# Patient Record
Sex: Male | Born: 1995
Health system: Southern US, Community
[De-identification: ages and names within clinical notes are randomized; demographics above are authoritative.]

## PROBLEM LIST (undated history)

## (undated) DIAGNOSIS — T7840XA Allergy, unspecified, initial encounter: Secondary | ICD-10-CM

## (undated) HISTORY — DX: Allergy, unspecified, initial encounter: T78.40XA

---
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
## 2018-05-07 ENCOUNTER — Ambulatory Visit: Payer: Self-pay

## 2018-05-07 NOTE — Telephone Encounter (Signed)
If he needs to get be sooner, we can find him a spot

## 2018-05-07 NOTE — Telephone Encounter (Signed)
Phone call from pt.  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  Stated he had the last episode about 2-3 mos. Ago, when he was doing conditioning exercise, and had not warmed up.  Stated he was on a treadmill, and started becoming short of breath, so he began taking deep breaths, then became dizzy, and started "dry heaving."  Also, reported his heart rate felt like it was going at a faster rate.  Denied obtaining a pulse at that time.  Stated he has also experienced a heaviness in chest at time of the shortness of breath and coughing episodes.  The pt. explained that it would be typical to do "conditioning" exercise after baseball practice; ie: push ups, jogging, or treadmill, or an exercise that would get his heart rate up.  Per pt., if he hasn't warmed-up prior to the conditioning routine, the episodes of shortness of breath and coughing up of mucus could be triggered.  Today, denied any shortness of breath or cough.  Stated he is able to go about his normal routine without any shortness of breath or cough.  Denied any chest congestion, chest pain, or SOB at present time.  Pt. was given a new patient appt. 06/19/18, since hasn't been seen at office since 2015.  Was advised to limit exercises to mild-moderate exertion level, until he is evaluated; encouraged to call back, if symptoms recur or worsen.   Verb. Understanding.  Agrees with plan.              Answer Assessment - Initial Assessment Questions 1. RESPIRATORY STATUS: "Describe your breathing?" (e.g., wheezing, shortness of breath, unable to speak, severe coughing)      Breathing at rest is normal  2. ONSET: "When did this breathing problem begin?"      Worsening over past 2 years; more frequent occurance 3. PATTERN "Does the difficult  breathing come and go, or has it been constant since it started?"      Intermittent  4. SEVERITY: "How bad is your breathing?" (e.g., mild, moderate, severe)    - MILD: No SOB at rest, mild SOB with walking, speaks normally in sentences, can lay down, no retractions, pulse < 100.    - MODERATE: SOB at rest, SOB with minimal exertion and prefers to sit, cannot lie down flat, speaks in phrases, mild retractions, audible wheezing, pulse 100-120.    - SEVERE: Very SOB at rest, speaks in single words, struggling to breathe, sitting hunched forward, retractions, pulse > 120      Moderate 5. RECURRENT SYMPTOM: "Have you had difficulty breathing before?" If so, ask: "When was the last time?" and "What happened that time?"      increase in shortness of breath in past 2 years when exercising.  6. CARDIAC HISTORY: "Do you have any history of heart disease?" (e.g., heart attack, angina, bypass surgery, angioplasty)      No hx of heart problems; hx of vasovagal response in his teens  7. LUNG HISTORY: "Do you have any history of lung disease?"  (e.g., pulmonary embolus, asthma, emphysema)     Denied asthma, COPD.  8. CAUSE: "What do you think is causing the breathing problem?"      unsure 9. OTHER SYMPTOMS: "Do you have any other symptoms? (e.g., dizziness, runny nose, cough, chest  pain, fever)    Occas. Lightheaded, a little bit of heaviness / cramp on left side of chest,  Protocols used: BREATHING DIFFICULTY-A-AH

## 2018-05-07 NOTE — Telephone Encounter (Signed)
FYI

## 2018-05-08 NOTE — Telephone Encounter (Signed)
Left a message for a return call.  CRM created. 

## 2018-05-08 NOTE — Telephone Encounter (Signed)
Pt now scheduled for 05/13/18.

## 2018-05-13 ENCOUNTER — Ambulatory Visit: Payer: 59 | Admitting: Adult Health

## 2018-05-13 VITALS — BP 120/60 | Temp 97.5°F | Wt 240.0 lb

## 2018-05-13 DIAGNOSIS — J45990 Exercise induced bronchospasm: Secondary | ICD-10-CM

## 2018-05-13 MED ORDER — ALBUTEROL SULFATE HFA 108 (90 BASE) MCG/ACT IN AERS: 2.0000 | INHALATION_SPRAY | Freq: Four times a day (QID) | RESPIRATORY_TRACT | 2 refills | Status: DC | PRN

## 2018-05-13 NOTE — Progress Notes (Signed)
Subjective:    Patient ID: Christopher Parks, male    DOB: May 21, 1996, 22 y.o.   MRN: [id number]  HPI 22 year old male who  has a past medical history of Allergy. He will be establishing care with me at a later date. Today he presents to the office today for an acute complaint SOB   He reports that he has shortness of breath and cough when he is working on conditioning exercises. He has noticed these episodes occurring intermittently over the last 4 years, but has been increasing in frequency over the last 2 years. Reports his symptoms are more present when he is running outside or doing aerobic exercise. He is training on becoming a [place] [employer] and has been exercising more frequently, so he has noticed his symptoms more frequent.y. Associated symptoms include the feeling of " someone sitting on my chest, wheezing and fast heart rate. He feels as though his symptoms last 2-20 minutes before they resolve  He has never been diagnosed with asthma. His mom has exercise Induced asthma.   He does not smoke   No symptoms at rest.    Review of Systems See HPI   Past Medical History:  Diagnosis Date  . Allergy     Social History   Socioeconomic History  . Marital status: Married    Spouse name: Not on file  . Number of children: Not on file  . Years of education: Not on file  . Highest education level: Not on file  Occupational History  . Not on file  Social Needs  . Financial resource strain: Not on file  . Food insecurity:    Worry: Not on file    Inability: Not on file  . Transportation needs:    Medical: Not on file    Non-medical: Not on file  Tobacco Use  . Smoking status: Never Smoker  Substance and Sexual Activity  . Alcohol use: No  . Drug use: No  . Sexual activity: Not on file  Lifestyle  . Physical activity:    Days per week: Not on file    Minutes per session: Not on file  . Stress: Not on file  Relationships  . Social connections:    Talks on phone: Not on  file    Gets together: Not on file    Attends religious service: Not on file    Active member of club or organization: Not on file    Attends meetings of clubs or organizations: Not on file    Relationship status: Not on file  . Intimate partner violence:    Fear of current or ex partner: Not on file    Emotionally abused: Not on file    Physically abused: Not on file    Forced sexual activity: Not on file  Other Topics Concern  . Not on file  Social History Narrative  . Not on file    No past surgical history on file.  No family history on file.  No Known Allergies  No current outpatient medications on file prior to visit.   No current facility-administered medications on file prior to visit.     BP 120/60   Temp (!) 97.5 F (36.4 C) (Oral)   Wt 240 lb (108.9 kg)       Objective:   Physical Exam  Constitutional: He is oriented to person, place, and time. He appears well-developed and well-nourished. No distress.  HENT:  Head: Normocephalic and atraumatic.  Mouth/Throat:  Oropharynx is clear and moist. No oropharyngeal exudate.  Neck: Normal range of motion. Neck supple.  Cardiovascular: Normal rate, regular rhythm, normal heart sounds and intact distal pulses. Exam reveals no gallop and no friction rub.  No murmur heard. Pulmonary/Chest: Effort normal and breath sounds normal. No stridor. No respiratory distress. He has no wheezes. He has no rales. He exhibits no tenderness.  Musculoskeletal: Normal range of motion. He exhibits no edema, tenderness or deformity.  Neurological: He is alert and oriented to person, place, and time.  Skin: Skin is warm and dry. Capillary refill takes less than 2 seconds. He is not diaphoretic.  Psychiatric: He has a normal mood and affect. His behavior is normal. Judgment and thought content normal.  Vitals reviewed.     Assessment & Plan:  1. Exercise-induced asthma - albuterol (PROVENTIL HFA;VENTOLIN HFA) 108 (90 Base) MCG/ACT  inhaler; Inhale 2 puffs into the lungs every 6 (six) hours as needed for wheezing or shortness of breath.  Dispense: 1 Inhaler; Refill: 2 - Follow up if no improvement over the next 2 weeks  - Consider referral to pulmonary and imaging   Dorothyann Peng, NP

## 2018-06-19 ENCOUNTER — Ambulatory Visit: Payer: 59 | Admitting: Adult Health

## 2019-12-29 ENCOUNTER — Other Ambulatory Visit: Payer: Self-pay | Admitting: *Deleted

## 2019-12-29 ENCOUNTER — Telehealth: Payer: Self-pay | Admitting: *Deleted

## 2019-12-29 DIAGNOSIS — R0600 Dyspnea, unspecified: Secondary | ICD-10-CM

## 2019-12-29 DIAGNOSIS — R002 Palpitations: Secondary | ICD-10-CM

## 2019-12-29 NOTE — Telephone Encounter (Signed)
Patient enrolled for [employer] to mail a 14 day ZIO XT long term holter monitor to college address, [address], [place], [place] [address].  Patient contact number [phone].  Reviewed instructions briefly as they are included in the monitor kit.

## 2020-01-01 ENCOUNTER — Telehealth: Payer: Self-pay

## 2020-01-01 DIAGNOSIS — R002 Palpitations: Secondary | ICD-10-CM

## 2020-01-01 DIAGNOSIS — R0600 Dyspnea, unspecified: Secondary | ICD-10-CM

## 2020-01-01 NOTE — Telephone Encounter (Signed)
-----   Message from Josue Hector, MD sent at 12/29/2019 11:03 AM EST ----- Add on to my schedule next week or two Needs 14 day monitor now can mail it to him or his parents He is in school at [employer] in [place] and parents in [place] have him see me in next two weeks for echo and new patient eval same day.  Having dyspnea and palpitations Mother has history of atrial myxoma   His cell phone is [phone]

## 2020-01-01 NOTE — Telephone Encounter (Signed)
Left message for patient to call back. Shelly, monitor tech has already  Sent patient a 14 day monitor. Will place order for echo and once patient calls back will arrange office visit with echo same day.

## 2020-01-01 NOTE — Telephone Encounter (Signed)
Called patient back. Made patient an appointment with Dr. Johnsie Cancel and Echo.

## 2020-01-01 NOTE — Telephone Encounter (Signed)
Patient returning call.

## 2020-01-04 NOTE — Progress Notes (Signed)
CARDIOLOGY CONSULT NOTE       Patient ID: Christopher Parks MRN: [id number] DOB/AGE: 07-08-1996 24 y.o.  Admit date: (Not on file) Referring Physician: Sherren Mocha Primary Physician: Dorena Cookey, MD (Inactive) Primary Cardiologist: New Reason for Consultation: Palpitations  Active Problems:   * No active hospital problems. *   HPI:  24 y.o. referred for palpitations.  Christopher Parks is a friend. He use to play baseball with my son. I take care of his mother Who has had atrial myoxoma resected. He is currently at [facility]. Has been having tachy- palpitations for weeks. Has some exercise induced asthma he uses inhaler for on occasion Non smoker Some ETOH on weekends not excessive No supplements, stimulants or other drugs. 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 Allergy     History reviewed. No pertinent family history.  Social History   Socioeconomic History  . Marital status: Married    Spouse name: Not on file  . Number of children: Not on file  . Years of education: Not on file  . Highest education level: Not on file  Occupational History  . Not on file  Tobacco Use  . Smoking status: Never Smoker  . Smokeless tobacco: Current User  Substance and Sexual Activity  . Alcohol use: Yes    Comment: 5-7 drinks every other week  . Drug use: No  . Sexual activity: Not on file  Other Topics Concern  . Not on file  Social History Narrative  . Not on file   Social Determinants of Health   Financial Resource Strain:   . Difficulty of Paying Living Expenses: Not on file  Food Insecurity:   . Worried About Charity fundraiser in the Last Year: Not on file  . Ran Out of Food in the Last Year:  Not on file  Transportation Needs:   . Lack of Transportation (Medical): Not on file  . Lack of Transportation (Non-Medical): Not on file  Physical Activity:   . Days of Exercise per Week: Not on file  . Minutes of Exercise per Session: Not on file  Stress:   . Feeling of Stress : Not on file  Social Connections:   . Frequency of Communication with Friends and Family: Not on file  . Frequency of Social Gatherings with Friends and Family: Not on file  . Attends Religious Services: Not on file  . Active Member of Clubs or Organizations: Not on file  . Attends Archivist Meetings: Not on file  . Marital Status: Not on file  Intimate Partner Violence:   . Fear of Current or Ex-Partner: Not on file  . Emotionally Abused: Not on file  . Physically Abused: Not on file  . Sexually Abused: Not on file    History reviewed. No pertinent surgical history.    Current Outpatient Medications:  .  propranolol (INDERAL) 10 MG tablet, Take 1 tablet (10 mg total) by mouth as needed (palpitations)., Disp: 30 tablet, Rfl: 2    Physical Exam: Blood pressure 110/68, pulse 77, height 5\' 11"  (1.803 m), weight 248 lb 12.8 oz (112.9 kg), SpO2 98 %.    Affect appropriate Healthy:  appears stated age  HEENT: normal Neck supple with no adenopathy JVP normal no bruits no thyromegaly Lungs clear with no wheezing and good diaphragmatic motion Heart:  S1/S2 no murmur, no rub, gallop or click PMI normal Abdomen: benighn, BS positve, no tenderness, no AAA no bruit.  No HSM or HJR Distal pulses intact with no bruits No edema Neuro non-focal Skin warm and dry No muscular weakness   Labs: None    Radiology: ECHOCARDIOGRAM COMPLETE  Result Date: 01/12/2020    ECHOCARDIOGRAM REPORT   Patient Name:   Christopher Parks Date of Exam: 01/12/2020 Medical Rec #:  [id number]      Height:       71.0 in Accession #:    [id number]     Weight:       240.0 lb Date of Birth:  29-Mar-1996       BSA:          2.28  m Patient Age:    24 years       BP:           120/60 mmHg Patient Gender: M              HR:           89 bpm. Exam Location:  [facility] Procedure: 2D Echo, 3D Echo, Cardiac Doppler, Color Doppler and Strain Analysis Indications:    R00.2 Palpitations                 R06.00 Dyspnea  History:        Patient has prior history of Echocardiogram examinations, most                 recent 05/22/2011. Signs/Symptoms:Dyspnea. COVID 19 Virus                 (November 2020), Palpitations.  Sonographer:    Deliah Boston RDCS Referring Phys: [place]  1. Left ventricular ejection fraction, by estimation, is 60 to 65%. Left ventricular ejection fraction by 3D volume is 60 %. The left ventricle has normal function. The left ventricle has no regional wall motion abnormalities. Left ventricular diastolic  parameters were normal. The average left ventricular global longitudinal strain is -18.0 %.  2. Right ventricular systolic function is normal. The right ventricular size is normal. Tricuspid regurgitation signal is inadequate for assessing PA pressure.  3. The mitral valve is grossly normal. No evidence of mitral valve regurgitation. No evidence of mitral stenosis.  4. The aortic valve is tricuspid. Aortic valve regurgitation is not visualized. No aortic stenosis is present.  5. The inferior vena cava is normal in size with greater than 50% respiratory variability, suggesting right atrial pressure of 3 mmHg. Conclusion(s)/Recomendation(s): Normal biventricular function without evidence of hemodynamically significant valvular heart disease. FINDINGS  Left Ventricle: Left ventricular ejection fraction, by estimation, is 60 to 65%. Left ventricular ejection fraction by 3D volume is 60 % The left ventricle has normal function. The left ventricle has no regional wall motion abnormalities. The average left ventricular global longitudinal strain is -18.0 %. The left ventricular internal cavity size was  normal in size. There is no left ventricular hypertrophy. Left ventricular diastolic parameters were normal. Right Ventricle: The right ventricular size is normal. No increase in right ventricular wall thickness. Right ventricular systolic function is normal. Tricuspid regurgitation signal is inadequate for assessing PA pressure. Left Atrium: Left atrial size was normal in size. Right Atrium: Right atrial size was normal in size. Pericardium: There  is no evidence of pericardial effusion. Mitral Valve: The mitral valve is grossly normal. No evidence of mitral valve regurgitation. No evidence of mitral valve stenosis. Tricuspid Valve: The tricuspid valve is grossly normal. Tricuspid valve regurgitation is not demonstrated. Aortic Valve: The aortic valve is tricuspid. Aortic valve regurgitation is not visualized. No aortic stenosis is present. Pulmonic Valve: The pulmonic valve was grossly normal. Pulmonic valve regurgitation is not visualized. No evidence of pulmonic stenosis. Aorta: The aortic root and ascending aorta are structurally normal, with no evidence of dilitation. Venous: The inferior vena cava is normal in size with greater than 50% respiratory variability, suggesting right atrial pressure of 3 mmHg. IAS/Shunts: No atrial level shunt detected by color flow Doppler.  LEFT VENTRICLE PLAX 2D LVIDd:         5.29 cm         Diastology LVIDs:         3.52 cm         LV e' lateral:   19.60 cm/s LV PW:         0.82 cm         LV E/e' lateral: 3.5 LV IVS:        0.64 cm         LV e' medial:    13.60 cm/s LVOT diam:     2.70 cm         LV E/e' medial:  5.0 LV SV:         124.82 ml LV SV Index:   35.10           2D LVOT Area:     5.73 cm        Longitudinal                                Strain                                2D Strain GLS  -18.0 %                                Avg:                                 3D Volume EF                                LV 3D EF:    Left                                              ventricular                                             ejection  fraction by                                             3D volume                                             is 60 % RIGHT VENTRICLE RV S prime:     14.50 cm/s TAPSE (M-mode): 2.6 cm LEFT ATRIUM           Index LA diam:      3.70 cm 1.62 cm/m LA Vol (A2C): 40.7 ml 17.86 ml/m  AORTIC VALVE LVOT Vmax:   115.00 cm/s LVOT Vmean:  74.700 cm/s LVOT VTI:    0.218 m  AORTA Ao Root diam: 3.00 cm Ao Asc diam:  2.80 cm MITRAL VALVE MV Area (PHT): cm         SHUNTS MV Decel Time: 211 msec    Systemic VTI:  0.22 m MV E velocity: 67.90 cm/s  Systemic Diam: 2.70 cm MV A velocity: 43.20 cm/s MV E/A ratio:  1.57 Eleonore Chiquito MD Electronically signed by Eleonore Chiquito MD Signature Date/Time: 01/12/2020/8:23:42 PM    Final     EKG: SR rate 80 normal 05/14/11  01/13/20 SR rate 75 normal    ASSESSMENT AND PLAN:   1. Palpitations: benign will review monitor when available PRN Inderal called in  2. Asthma:  Has used inhaler in past no active wheezing 3. Anxiety: normal for current college student, during [place] restrictions and stress of gaining Entrance into work force in his case [employer]   Signed: Jenkins Rouge 01/13/2020, 11:48 AM

## 2020-01-07 NOTE — Telephone Encounter (Signed)
[employer] was called.  Monitor was delivered 12/31/2019 at 2:01 PM to [address] [place] , [place].  Package was signed for by a Mr. Or Ms. Soto. 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 Patient given [employer] telephone number [phone].  If he cannot track down his package, please call [employer] and they will overnight a second monitor.

## 2020-01-07 NOTE — Telephone Encounter (Signed)
Patient states that his address is [address] in [place]. Just wanted to make sure this would not be an issue when shipping.

## 2020-01-08 ENCOUNTER — Other Ambulatory Visit (INDEPENDENT_AMBULATORY_CARE_PROVIDER_SITE_OTHER): Payer: 59

## 2020-01-08 DIAGNOSIS — R0600 Dyspnea, unspecified: Secondary | ICD-10-CM

## 2020-01-08 DIAGNOSIS — R002 Palpitations: Secondary | ICD-10-CM

## 2020-01-12 ENCOUNTER — Other Ambulatory Visit: Payer: Self-pay

## 2020-01-12 ENCOUNTER — Ambulatory Visit (HOSPITAL_COMMUNITY): Payer: 59 | Attending: Cardiovascular Disease

## 2020-01-12 DIAGNOSIS — R002 Palpitations: Secondary | ICD-10-CM

## 2020-01-12 DIAGNOSIS — R0600 Dyspnea, unspecified: Secondary | ICD-10-CM | POA: Diagnosis present

## 2020-01-13 ENCOUNTER — Encounter: Payer: Self-pay | Admitting: Cardiovascular Disease

## 2020-01-13 ENCOUNTER — Ambulatory Visit: Payer: 59 | Admitting: Cardiovascular Disease

## 2020-01-13 VITALS — BP 110/68 | HR 77 | Ht 71.0 in | Wt 248.8 lb

## 2020-01-13 DIAGNOSIS — R002 Palpitations: Secondary | ICD-10-CM

## 2020-01-13 MED ORDER — PROPRANOLOL HCL 10 MG PO TABS: 10.0000 mg | ORAL_TABLET | ORAL | 2 refills | Status: AC | PRN

## 2020-01-13 NOTE — Patient Instructions (Addendum)
Medication Instructions:  Your physician has recommended you make the following change in your medication:  1-Take Inderal 10 mg by mouth as needed for palpitations.  *If you need a refill on your cardiac medications before your next appointment, please call your pharmacy*  Lab Work:  If you have labs (blood work) drawn today and your tests are completely normal, you will receive your results only by: Marland Kitchen MyChart Message (if you have MyChart) OR . A paper copy in the mail If you have any lab test that is abnormal or we need to change your treatment, we will call you to review the results.  Testing/Procedures: None ordered today.  Follow-Up: At [facility], you and your health needs are our priority.  As part of our continuing mission to provide you with exceptional heart care, we have created designated Provider Care Teams.  These Care Teams include your primary Cardiologist (physician) and Advanced Practice Providers (APPs -  Physician Assistants and Nurse Practitioners) who all work together to provide you with the care you need, when you need it.  Your next appointment:   As needed  The format for your next appointment:   In Person  Provider:   You may see Dr. Johnsie Cancel or one of the following Advanced Practice Providers on your designated Care Team:    Truitt Merle, NP  Cecilie Kicks, NP  Kathyrn Drown, NP

## 2020-02-02 ENCOUNTER — Telehealth: Payer: Self-pay

## 2020-02-02 DIAGNOSIS — R002 Palpitations: Secondary | ICD-10-CM

## 2020-02-02 DIAGNOSIS — R001 Bradycardia, unspecified: Secondary | ICD-10-CM

## 2020-02-02 DIAGNOSIS — R0600 Dyspnea, unspecified: Secondary | ICD-10-CM

## 2020-02-02 NOTE — Telephone Encounter (Signed)
-----   Message from Josue Hector, MD sent at 02/01/2020  8:37 AM EST ----- No rapid arrhythmias but HR slow at times longest 7 second pause Needs cardiac MRI to r/o infiltrative DCM Echo was fine Likely pauses from vagal tone but needs MRI

## 2020-02-02 NOTE — Telephone Encounter (Signed)
Called patient with results. Patient will be in town for over a week,  will ordered cardiac MRI.

## 2020-02-15 ENCOUNTER — Telehealth: Payer: Self-pay | Admitting: *Deleted

## 2020-02-15 ENCOUNTER — Encounter: Payer: Self-pay | Admitting: Cardiovascular Disease

## 2020-02-15 NOTE — Telephone Encounter (Signed)
Left message regarding appointment for Cardiac MRI scheduled Monday 03/07/20 at 2:00pm at [facility]---arrival time is 1:15 pm 1st floor radiology---will mail information to patient

## 2020-03-01 ENCOUNTER — Encounter: Payer: Self-pay | Admitting: Cardiovascular Disease

## 2020-03-01 NOTE — Telephone Encounter (Signed)
Left message regarding appointment for Cardiac MRI scheduled Monday 03/28/20 at 9:00 am [facility].  Arrival time will be 8:15 am --1st floor radiology---will mail information to patient.

## 2020-03-07 ENCOUNTER — Other Ambulatory Visit (HOSPITAL_COMMUNITY): Payer: 59

## 2020-03-17 ENCOUNTER — Telehealth: Payer: Self-pay | Admitting: *Deleted

## 2020-03-17 ENCOUNTER — Encounter: Payer: Self-pay | Admitting: Cardiovascular Disease

## 2020-03-17 NOTE — Telephone Encounter (Signed)
Left message for patient regarding appointment for Cardiac MRI scheduled Monday 04/18/20 at 8:00 am at [facility]---arrival time is 7:15 am 1st floor admissions office.  Will mail updated information to patient

## 2020-03-28 ENCOUNTER — Other Ambulatory Visit (HOSPITAL_COMMUNITY): Payer: 59

## 2020-04-15 ENCOUNTER — Telehealth (HOSPITAL_COMMUNITY): Payer: Self-pay | Admitting: *Deleted

## 2020-04-15 NOTE — Telephone Encounter (Signed)
Reaching out to patient to offer assistance regarding upcoming cardiac imaging study; pt verbalizes understanding of appt date/time, parking situation and where to check in, and verified current allergies; name and call back number provided for further questions should they arise  [facility] [phone] office [phone] cell

## 2020-04-18 ENCOUNTER — Other Ambulatory Visit: Payer: Self-pay

## 2020-04-18 ENCOUNTER — Ambulatory Visit: Payer: 59 | Admitting: Cardiovascular Disease

## 2020-04-18 ENCOUNTER — Other Ambulatory Visit: Payer: Self-pay | Admitting: Cardiovascular Disease

## 2020-04-18 ENCOUNTER — Ambulatory Visit (HOSPITAL_COMMUNITY)
Admission: RE | Admit: 2020-04-18 | Discharge: 2020-04-18 | Disposition: A | Discharge status: Home / Self Care | Payer: 59 | Source: Ambulatory Visit | Attending: Cardiovascular Disease | Admitting: Cardiovascular Disease

## 2020-04-18 ENCOUNTER — Inpatient Hospital Stay (HOSPITAL_COMMUNITY): Admission: RE | Admit: 2020-04-18 | Payer: 59 | Source: Ambulatory Visit

## 2020-04-18 DIAGNOSIS — R001 Bradycardia, unspecified: Secondary | ICD-10-CM | POA: Diagnosis not present

## 2020-04-18 DIAGNOSIS — R002 Palpitations: Secondary | ICD-10-CM | POA: Insufficient documentation

## 2020-04-18 DIAGNOSIS — R0600 Dyspnea, unspecified: Secondary | ICD-10-CM | POA: Insufficient documentation

## 2020-04-18 MED ORDER — GADOBUTROL 1 MMOL/ML IV SOLN
10.0000 mL | Freq: Once | INTRAVENOUS | Status: AC | PRN
  Administered 2020-04-18: 10 mL via INTRAVENOUS

## 2020-05-27 ENCOUNTER — Ambulatory Visit
Admission: RE | Admit: 2020-05-27 | Discharge: 2020-05-27 | Disposition: A | Discharge status: Home / Self Care | Payer: No Typology Code available for payment source | Source: Ambulatory Visit | Attending: Nurse Practitioner | Admitting: Nurse Practitioner

## 2020-05-27 ENCOUNTER — Other Ambulatory Visit: Payer: Self-pay | Admitting: Nurse Practitioner

## 2020-05-27 DIAGNOSIS — Z021 Encounter for pre-employment examination: Secondary | ICD-10-CM

## 2021-06-09 IMAGING — MR MR CARD MORPHOLOGY WO/W CM
39 of 42 series · 39 of 42 positions shown · IV contrast (gadavist)
Comparison: none

CLINICAL DATA: Bradycardia R/O Infiltrative DCM

EXAM:
CARDIAC MRI
TECHNIQUE: The patient was scanned on a 1.5 Tesla GE magnet. A dedicated
cardiac coil was used. Functional imaging was done using Fiesta
sequences. [DATE], and 4 chamber views were done to assess for RWMA's.
Modified De Andrade rule using a short axis stack was used to
calculate an ejection fraction on a dedicated work station using
Circle software. The patient received 10 cc of Gadavist. After 10
minutes inversion recovery sequences were used to assess for
infiltration and scar tissue.
CONTRAST:  Gadavist

[Series 4: [id number]_bh · axial · 8.0mm · 1.33mm/px · 1 of 16 slices shown]
[im 1/16]
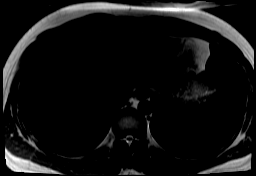

[Series 10: bSSFP · oblique · 8.0mm · 1.61mm/px · 1 of 25 slices shown (1 of 22)]
[im 1/25]
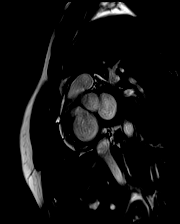

[Series 11: bSSFP · oblique · 8.0mm · 1.61mm/px · 1 of 25 slices shown (2 of 22)]
[im 1/25]
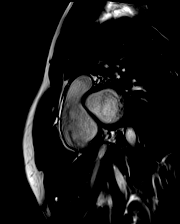

[Series 12: bSSFP · oblique · 8.0mm · 1.61mm/px · 1 of 25 slices shown (3 of 22)]
[im 1/25]
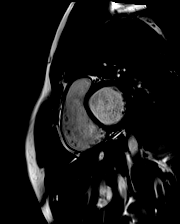

[Series 13: bSSFP · oblique · 8.0mm · 1.61mm/px · 1 of 25 slices shown (4 of 22)]
[im 1/25]
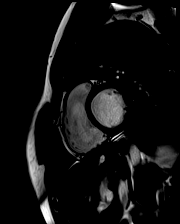

[Series 14: bSSFP · oblique · 8.0mm · 1.61mm/px · 1 of 25 slices shown (5 of 22)]
[im 1/25]
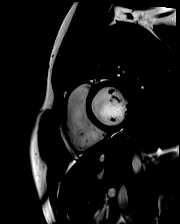

[Series 15: bSSFP · oblique · 8.0mm · 1.61mm/px · 1 of 25 slices shown (6 of 22)]
[im 1/25]
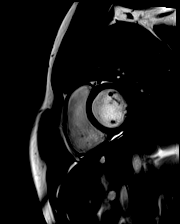

[Series 16: bSSFP · oblique · 8.0mm · 1.61mm/px · 1 of 25 slices shown (7 of 22)]
[im 1/25]
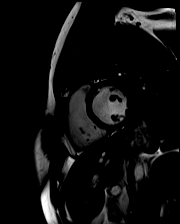

[Series 17: bSSFP · oblique · 8.0mm · 1.61mm/px · 1 of 25 slices shown (8 of 22)]
[im 1/25]
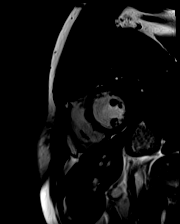

[Series 18: bSSFP · oblique · 8.0mm · 1.61mm/px · 1 of 25 slices shown (9 of 22)]
[im 1/25]
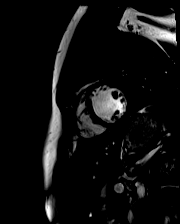

[Series 19: bSSFP · oblique · 8.0mm · 1.61mm/px · 1 of 25 slices shown (10 of 22)]
[im 1/25]
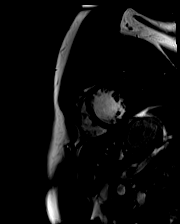

[Series 20: bSSFP · oblique · 8.0mm · 1.61mm/px · 1 of 25 slices shown (11 of 22)]
[im 1/25]
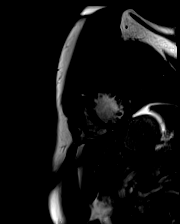

[Series 21: bSSFP · oblique · 8.0mm · 1.61mm/px · 1 of 25 slices shown (12 of 22)]
[im 1/25]
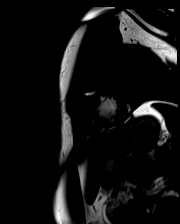

[Series 22: bSSFP · oblique · 8.0mm · 1.61mm/px · 1 of 25 slices shown (13 of 22)]
[im 1/25]
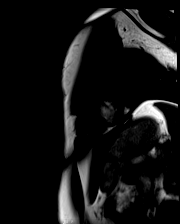

[Series 23: bSSFP · oblique · 8.0mm · 1.61mm/px · 1 of 25 slices shown (14 of 22)]
[im 1/25]
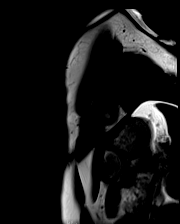

[Series 24: bSSFP · oblique · 8.0mm · 1.61mm/px · 1 of 25 slices shown (15 of 22)]
[im 1/25]
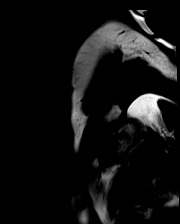

[Series 25: bSSFP · oblique · 8.0mm · 1.61mm/px · 1 of 25 slices shown (16 of 22)]
[im 1/25]
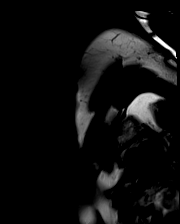

[Series 26: (id)_long_t1 · oblique · 8.0mm · 1.41mm/px · 1 of 24 slices shown]
[im 1/24]
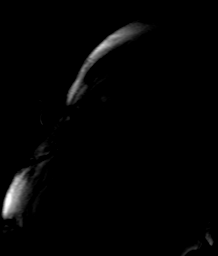

[Series 27: (id)_long_t1_moco · oblique · 8.0mm · 1.41mm/px · 1 of 24 slices shown]
[im 1/24]
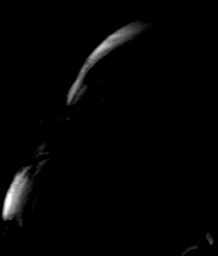

[Series 28: (id)_long_t1_moco_t1 · oblique · 8.0mm · 1.41mm/px · 1 of 6 slices shown]
[im 1/6]
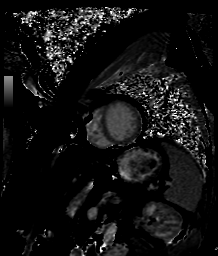

[Series 30: (id)_trufi · oblique · 8.0mm · 1.88mm/px · 1 of 9 slices shown]
[im 1/9]
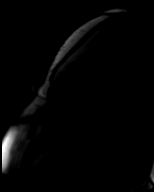

[Series 31: (id)_trufi_moco · oblique · 8.0mm · 1.88mm/px · 1 of 9 slices shown]
[im 1/9]
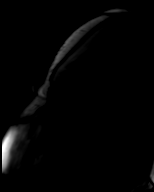

[Series 32: (id)_trufi_moco_t2 · oblique · 8.0mm · 1.88mm/px · 1 of 3 slices shown]
[im 1/3]
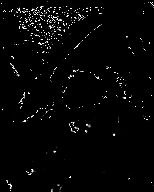

[Series 34: bSSFP · oblique · 6.0mm · 1.41mm/px · 1 of 25 slices shown (17 of 22)]
[im 1/25]
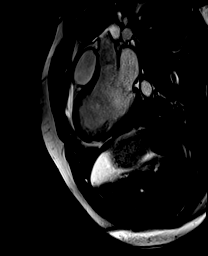

[Series 35: bSSFP · oblique · 6.0mm · 1.41mm/px · 1 of 25 slices shown (18 of 22)]
[im 1/25]
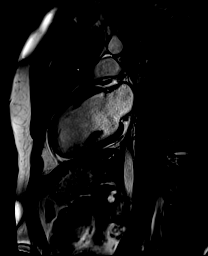

[Series 36: bSSFP · oblique · 6.0mm · 1.41mm/px · 1 of 25 slices shown (19 of 22)]
[im 1/25]
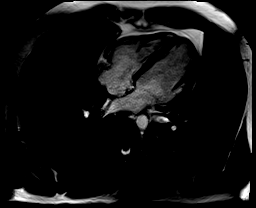

[Series 37: STIR · oblique · 8.0mm · 1.73mm/px · 1 of 17 slices shown]
[im 1/17]
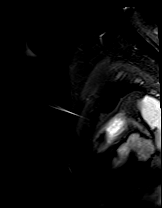

[Series 38: bSSFP · coronal · 6.0mm · 1.41mm/px · 1 of 25 slices shown (20 of 22)]
[im 1/25]
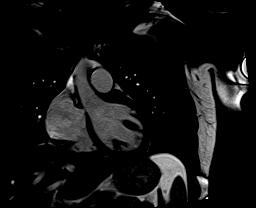

[Series 39: aortic valve cine · axial · 6.0mm · 1.41mm/px · 1 of 25 slices shown (1 of 2)]
[im 1/25]
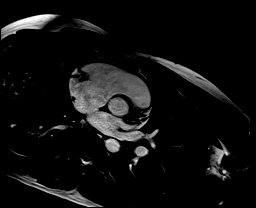

[Series 40: cine rvot · sagittal · 6.0mm · 1.41mm/px · 1 of 25 slices shown]
[im 1/25]
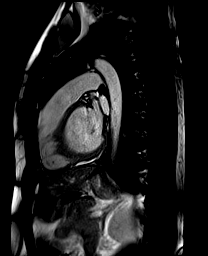

[Series 41: aortic valve cine · axial · 6.0mm · 1.41mm/px · 1 of 25 slices shown (2 of 2)]
[im 1/25]
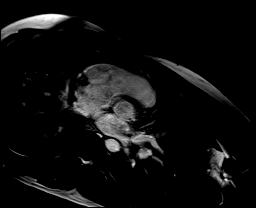

[Series 42: cine rvit · coronal · 6.0mm · 1.41mm/px · 1 of 25 slices shown]
[im 1/25]
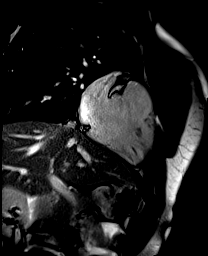

[Series 44: lge_single shot sa · oblique · 8.0mm · 1.98mm/px · 1 of 10 slices shown (1 of 2)]
[im 1/10]
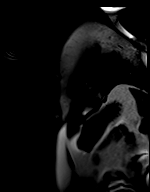

[Series 45: lge_single shot sa · oblique · 8.0mm · 1.98mm/px · 1 of 10 slices shown (2 of 2)]
[im 1/10]
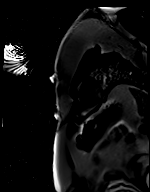

[Series 52: (id)_short_t1 · oblique · 8.0mm · 1.41mm/px · 1 of 27 slices shown]
[im 1/27]
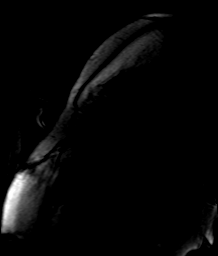

[Series 53: (id)_short_t1_moco · oblique · 8.0mm · 1.41mm/px · 1 of 27 slices shown]
[im 1/27]
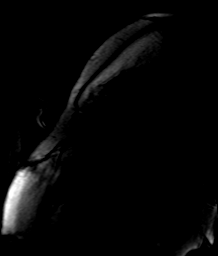

[Series 54: (id)_short_t1_moco_t1 · oblique · 8.0mm · 1.41mm/px · 1 of 6 slices shown]
[im 1/6]
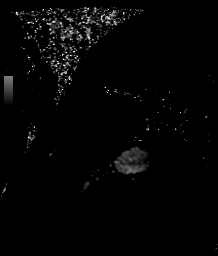

[Series 57: bSSFP · oblique · 6.0mm · 1.73mm/px · 1 of 19 slices shown (21 of 22)]
[im 1/19]
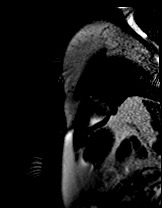

[Series 58: bSSFP · oblique · 6.0mm · 1.73mm/px · 1 of 19 slices shown (22 of 22)]
[im 1/19]
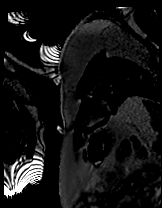

[39 of 42 positions shown; findings below may reference images not displayed]

FINDINGS: Normal cardiac chamber sizes. No ASD/PFO. Normal aortic root. No
pericardial effusion. Normal MV, TV, AV. Normal origin of LM and RCA
arteries

Quantitative EF 57%. (EDV 187 cc ESV 80 cc SV 107 cc) no RWMAls.
Delayed enhancement images with gadolinium showed no infiltration,
scar, infarct or abnormality Normal RV size and function
IMPRESSION: 1. Normal Cardiac MRI

2.  Normal TV, MV, AV

3.  Normal RV size and function

4.  Normal LV size and function quantitative EF 57%

5.  No pericardial effusion

6.  No ASD/PFO

7.  Normal aortic root 2.8 cm

8. No delayed gadolinium uptake in myocardium/pericardium on delayed
inversion recovery sequences

Sangsa Kaish

## 2023-01-10 ENCOUNTER — Other Ambulatory Visit (HOSPITAL_COMMUNITY): Payer: Self-pay

## 2023-01-10 MED ORDER — LISDEXAMFETAMINE DIMESYLATE 30 MG PO CAPS
30.0000 mg | ORAL_CAPSULE | Freq: Every morning | ORAL | 0 refills | Status: AC
  Filled 2023-01-10: qty 15, 15d supply, fill #0

## 2023-01-22 ENCOUNTER — Other Ambulatory Visit (HOSPITAL_COMMUNITY): Payer: Self-pay

## 2023-01-22 MED ORDER — LISDEXAMFETAMINE DIMESYLATE 40 MG PO CAPS
40.0000 mg | ORAL_CAPSULE | Freq: Every morning | ORAL | 0 refills | Status: DC
  Filled 2023-01-22: qty 15, 15d supply, fill #0

## 2023-02-06 ENCOUNTER — Other Ambulatory Visit (HOSPITAL_COMMUNITY): Payer: Self-pay

## 2023-02-06 MED ORDER — LISDEXAMFETAMINE DIMESYLATE 40 MG PO CAPS
40.0000 mg | ORAL_CAPSULE | Freq: Every morning | ORAL | 0 refills | Status: DC
  Filled 2023-02-06: qty 30, 30d supply, fill #0

## 2023-02-06 MED ORDER — FLUOXETINE HCL 40 MG PO CAPS
40.0000 mg | ORAL_CAPSULE | Freq: Every day | ORAL | 0 refills | Status: DC
  Filled 2023-02-06 – 2023-02-28 (×2): qty 30, 30d supply, fill #0

## 2023-02-06 MED ORDER — LISDEXAMFETAMINE DIMESYLATE 20 MG PO CAPS
20.0000 mg | ORAL_CAPSULE | ORAL | 0 refills | Status: DC
  Filled 2023-02-06: qty 30, 30d supply, fill #0

## 2023-02-28 ENCOUNTER — Other Ambulatory Visit (HOSPITAL_COMMUNITY): Payer: Self-pay

## 2023-03-01 ENCOUNTER — Other Ambulatory Visit (HOSPITAL_COMMUNITY): Payer: Self-pay

## 2023-03-08 ENCOUNTER — Other Ambulatory Visit (HOSPITAL_COMMUNITY): Payer: Self-pay

## 2023-03-08 MED ORDER — FLUOXETINE HCL 40 MG PO CAPS
40.0000 mg | ORAL_CAPSULE | Freq: Every day | ORAL | 0 refills | Status: DC
  Filled 2023-03-08 – 2023-04-01 (×2): qty 30, 30d supply, fill #0

## 2023-03-08 MED ORDER — LISDEXAMFETAMINE DIMESYLATE 40 MG PO CAPS
40.0000 mg | ORAL_CAPSULE | Freq: Every morning | ORAL | 0 refills | Status: AC
  Filled 2023-03-08: qty 30, 30d supply, fill #0

## 2023-03-08 MED ORDER — LISDEXAMFETAMINE DIMESYLATE 20 MG PO CAPS
20.0000 mg | ORAL_CAPSULE | ORAL | 0 refills | Status: AC
  Filled 2023-03-08: qty 30, 30d supply, fill #0

## 2023-03-11 ENCOUNTER — Other Ambulatory Visit (HOSPITAL_COMMUNITY): Payer: Self-pay

## 2023-04-01 ENCOUNTER — Other Ambulatory Visit (HOSPITAL_COMMUNITY): Payer: Self-pay

## 2023-04-05 ENCOUNTER — Other Ambulatory Visit (HOSPITAL_COMMUNITY): Payer: Self-pay

## 2023-04-05 MED ORDER — LISDEXAMFETAMINE DIMESYLATE 40 MG PO CAPS
40.0000 mg | ORAL_CAPSULE | Freq: Every morning | ORAL | 0 refills | Status: DC
  Filled 2023-05-31: qty 30, 30d supply, fill #0

## 2023-04-05 MED ORDER — LISDEXAMFETAMINE DIMESYLATE 40 MG PO CAPS
40.0000 mg | ORAL_CAPSULE | Freq: Every morning | ORAL | 0 refills | Status: AC
  Filled 2023-04-25: qty 30, 30d supply, fill #0

## 2023-04-05 MED ORDER — LISDEXAMFETAMINE DIMESYLATE 20 MG PO CAPS
20.0000 mg | ORAL_CAPSULE | Freq: Every day | ORAL | 0 refills | Status: DC
  Filled 2023-05-31: qty 30, 30d supply, fill #0

## 2023-04-05 MED ORDER — LISDEXAMFETAMINE DIMESYLATE 20 MG PO CAPS
20.0000 mg | ORAL_CAPSULE | Freq: Every day | ORAL | 0 refills | Status: AC
  Filled 2023-04-25: qty 30, 30d supply, fill #0

## 2023-04-05 MED ORDER — FLUOXETINE HCL 40 MG PO CAPS
40.0000 mg | ORAL_CAPSULE | Freq: Every day | ORAL | 1 refills | Status: DC
  Filled 2023-04-05 – 2023-04-25 (×2): qty 30, 30d supply, fill #0
  Filled 2023-05-31: qty 30, 30d supply, fill #1

## 2023-04-11 ENCOUNTER — Other Ambulatory Visit (HOSPITAL_COMMUNITY): Payer: Self-pay

## 2023-04-25 ENCOUNTER — Other Ambulatory Visit (HOSPITAL_COMMUNITY): Payer: Self-pay

## 2023-04-25 ENCOUNTER — Other Ambulatory Visit: Payer: Self-pay

## 2023-04-26 ENCOUNTER — Other Ambulatory Visit (HOSPITAL_COMMUNITY): Payer: Self-pay

## 2023-04-26 ENCOUNTER — Other Ambulatory Visit: Payer: Self-pay

## 2023-04-27 ENCOUNTER — Other Ambulatory Visit (HOSPITAL_COMMUNITY): Payer: Self-pay

## 2023-05-31 ENCOUNTER — Other Ambulatory Visit (HOSPITAL_COMMUNITY): Payer: Self-pay

## 2023-06-11 ENCOUNTER — Other Ambulatory Visit (HOSPITAL_COMMUNITY): Payer: Self-pay

## 2023-06-11 ENCOUNTER — Other Ambulatory Visit: Payer: Self-pay

## 2023-06-11 MED ORDER — METHYLPHENIDATE HCL ER (OSM) 36 MG PO TBCR
36.0000 mg | EXTENDED_RELEASE_TABLET | Freq: Every morning | ORAL | 0 refills | Status: AC
  Filled 2023-06-11: qty 10, 10d supply, fill #0

## 2023-06-11 MED ORDER — METHYLPHENIDATE HCL ER (OSM) 18 MG PO TBCR
18.0000 mg | EXTENDED_RELEASE_TABLET | Freq: Every day | ORAL | 0 refills | Status: AC
  Filled 2023-06-11: qty 10, 10d supply, fill #0

## 2023-06-13 ENCOUNTER — Other Ambulatory Visit: Payer: Self-pay

## 2023-06-15 ENCOUNTER — Other Ambulatory Visit (HOSPITAL_COMMUNITY): Payer: Self-pay

## 2023-07-01 ENCOUNTER — Other Ambulatory Visit (HOSPITAL_COMMUNITY): Payer: Self-pay

## 2023-07-01 MED ORDER — LISDEXAMFETAMINE DIMESYLATE 40 MG PO CAPS
40.0000 mg | ORAL_CAPSULE | Freq: Every morning | ORAL | 0 refills | Status: AC
  Filled 2023-07-01: qty 15, 15d supply, fill #0

## 2023-07-01 MED ORDER — LISDEXAMFETAMINE DIMESYLATE 20 MG PO CAPS
20.0000 mg | ORAL_CAPSULE | Freq: Every day | ORAL | 0 refills | Status: AC
  Filled 2023-07-01: qty 15, 15d supply, fill #0

## 2023-07-01 MED ORDER — FLUOXETINE HCL 40 MG PO CAPS
40.0000 mg | ORAL_CAPSULE | Freq: Every day | ORAL | 1 refills | Status: DC
  Filled 2023-07-01: qty 30, 30d supply, fill #0
  Filled 2023-07-24: qty 30, 30d supply, fill #1

## 2023-07-02 ENCOUNTER — Other Ambulatory Visit: Payer: Self-pay

## 2023-07-24 ENCOUNTER — Other Ambulatory Visit (HOSPITAL_COMMUNITY): Payer: Self-pay

## 2023-07-24 MED ORDER — AMPHETAMINE-DEXTROAMPHET ER 25 MG PO CP24
25.0000 mg | ORAL_CAPSULE | Freq: Every morning | ORAL | 0 refills | Status: AC
  Filled 2023-07-24: qty 10, 10d supply, fill #0

## 2023-07-25 ENCOUNTER — Other Ambulatory Visit (HOSPITAL_COMMUNITY): Payer: Self-pay

## 2023-07-30 ENCOUNTER — Other Ambulatory Visit (HOSPITAL_COMMUNITY): Payer: Self-pay

## 2023-07-30 MED ORDER — AMPHETAMINE-DEXTROAMPHET ER 30 MG PO CP24
30.0000 mg | ORAL_CAPSULE | Freq: Every morning | ORAL | 0 refills | Status: DC
Start: 2023-07-30 — End: 2023-08-13
  Filled 2023-07-30: qty 10, 10d supply, fill #0

## 2023-08-01 ENCOUNTER — Other Ambulatory Visit (HOSPITAL_COMMUNITY): Payer: Self-pay

## 2023-08-13 ENCOUNTER — Other Ambulatory Visit (HOSPITAL_COMMUNITY): Payer: Self-pay

## 2023-08-13 MED ORDER — AMPHETAMINE-DEXTROAMPHET ER 30 MG PO CP24
30.0000 mg | ORAL_CAPSULE | Freq: Every morning | ORAL | 0 refills | Status: AC
Start: 2023-08-13 — End: ?
  Filled 2023-08-13: qty 30, 30d supply, fill #0

## 2023-08-30 ENCOUNTER — Other Ambulatory Visit (HOSPITAL_COMMUNITY): Payer: Self-pay

## 2023-08-30 MED ORDER — FLUOXETINE HCL 20 MG PO CAPS
20.0000 mg | ORAL_CAPSULE | Freq: Every day | ORAL | 0 refills | Status: DC
  Filled 2023-08-30: qty 30, 30d supply, fill #0

## 2023-08-30 MED ORDER — FLUOXETINE HCL 40 MG PO CAPS
40.0000 mg | ORAL_CAPSULE | Freq: Every day | ORAL | 1 refills | Status: AC
  Filled 2023-08-30: qty 30, 30d supply, fill #0
  Filled 2024-02-03: qty 30, 30d supply, fill #1

## 2023-09-10 ENCOUNTER — Other Ambulatory Visit (HOSPITAL_COMMUNITY): Payer: Self-pay

## 2023-09-10 MED ORDER — AMPHETAMINE-DEXTROAMPHET ER 30 MG PO CP24
30.0000 mg | ORAL_CAPSULE | Freq: Every morning | ORAL | 0 refills | Status: AC
  Filled 2023-09-13: qty 30, 30d supply, fill #0

## 2023-09-10 MED ORDER — FLUOXETINE HCL 20 MG PO CAPS
20.0000 mg | ORAL_CAPSULE | Freq: Every day | ORAL | 1 refills | Status: DC
  Filled 2023-09-30: qty 30, 30d supply, fill #0
  Filled 2023-10-29: qty 30, 30d supply, fill #1

## 2023-09-10 MED ORDER — FLUOXETINE HCL 40 MG PO CAPS
40.0000 mg | ORAL_CAPSULE | Freq: Every day | ORAL | 1 refills | Status: AC
  Filled 2023-09-30: qty 30, 30d supply, fill #0
  Filled 2023-10-29: qty 30, 30d supply, fill #1

## 2023-09-10 MED ORDER — AMPHETAMINE-DEXTROAMPHET ER 30 MG PO CP24
30.0000 mg | ORAL_CAPSULE | Freq: Every morning | ORAL | 0 refills | Status: AC
  Filled 2023-10-15: qty 30, 30d supply, fill #0

## 2023-09-12 ENCOUNTER — Other Ambulatory Visit: Payer: Self-pay

## 2023-09-12 ENCOUNTER — Other Ambulatory Visit (HOSPITAL_COMMUNITY): Payer: Self-pay

## 2023-09-13 ENCOUNTER — Other Ambulatory Visit (HOSPITAL_COMMUNITY): Payer: Self-pay

## 2023-09-30 ENCOUNTER — Other Ambulatory Visit (HOSPITAL_COMMUNITY): Payer: Self-pay

## 2023-10-15 ENCOUNTER — Other Ambulatory Visit (HOSPITAL_COMMUNITY): Payer: Self-pay

## 2023-10-29 ENCOUNTER — Other Ambulatory Visit (HOSPITAL_COMMUNITY): Payer: Self-pay

## 2023-10-30 ENCOUNTER — Other Ambulatory Visit (HOSPITAL_COMMUNITY): Payer: Self-pay

## 2023-11-05 ENCOUNTER — Other Ambulatory Visit (HOSPITAL_COMMUNITY): Payer: Self-pay

## 2023-11-05 MED ORDER — AMPHETAMINE-DEXTROAMPHET ER 30 MG PO CP24
30.0000 mg | ORAL_CAPSULE | Freq: Every morning | ORAL | 0 refills | Status: AC
  Filled 2023-12-20: qty 30, 30d supply, fill #0

## 2023-11-05 MED ORDER — AMPHETAMINE-DEXTROAMPHET ER 30 MG PO CP24
30.0000 mg | ORAL_CAPSULE | Freq: Every morning | ORAL | 0 refills | Status: AC
  Filled 2024-01-23: qty 30, 30d supply, fill #0

## 2023-11-05 MED ORDER — FLUOXETINE HCL 40 MG PO CAPS
40.0000 mg | ORAL_CAPSULE | Freq: Every day | ORAL | 1 refills | Status: AC
Start: 2023-11-05 — End: ?
  Filled 2023-11-05 – 2023-12-20 (×2): qty 90, 90d supply, fill #0
  Filled 2023-12-23 – 2024-02-27 (×2): qty 30, 30d supply, fill #0
  Filled 2024-04-04: qty 30, 30d supply, fill #1
  Filled 2024-08-10 (×2): qty 30, 30d supply, fill #2

## 2023-11-05 MED ORDER — FLUOXETINE HCL 40 MG PO CAPS
40.0000 mg | ORAL_CAPSULE | Freq: Every day | ORAL | 1 refills | Status: AC
  Filled 2023-11-05 – 2023-11-29 (×2): qty 30, 30d supply, fill #0
  Filled 2024-01-04: qty 30, 30d supply, fill #1

## 2023-11-05 MED ORDER — FLUOXETINE HCL 20 MG PO CAPS
20.0000 mg | ORAL_CAPSULE | Freq: Every day | ORAL | 1 refills | Status: AC
  Filled 2023-11-05 – 2024-02-27 (×4): qty 30, 30d supply, fill #0
  Filled 2024-04-04: qty 30, 30d supply, fill #1

## 2023-11-05 MED ORDER — AMPHETAMINE-DEXTROAMPHET ER 30 MG PO CP24
30.0000 mg | ORAL_CAPSULE | Freq: Every morning | ORAL | 0 refills | Status: AC
  Filled 2023-11-18: qty 30, 30d supply, fill #0

## 2023-11-05 MED ORDER — FLUOXETINE HCL 20 MG PO CAPS
20.0000 mg | ORAL_CAPSULE | Freq: Every day | ORAL | 0 refills | Status: AC
  Filled 2023-11-05: qty 90, 90d supply, fill #0
  Filled 2023-11-29: qty 30, 30d supply, fill #0
  Filled 2024-01-04: qty 30, 30d supply, fill #1
  Filled 2024-02-03: qty 30, 30d supply, fill #2

## 2023-11-18 ENCOUNTER — Other Ambulatory Visit: Payer: Self-pay

## 2023-11-18 ENCOUNTER — Other Ambulatory Visit (HOSPITAL_COMMUNITY): Payer: Self-pay

## 2023-11-29 ENCOUNTER — Other Ambulatory Visit (HOSPITAL_COMMUNITY): Payer: Self-pay

## 2023-12-02 ENCOUNTER — Other Ambulatory Visit (HOSPITAL_COMMUNITY): Payer: Self-pay

## 2023-12-20 ENCOUNTER — Other Ambulatory Visit (HOSPITAL_COMMUNITY): Payer: Self-pay

## 2023-12-23 ENCOUNTER — Other Ambulatory Visit (HOSPITAL_COMMUNITY): Payer: Self-pay

## 2024-01-02 ENCOUNTER — Other Ambulatory Visit (HOSPITAL_COMMUNITY): Payer: Self-pay

## 2024-01-03 ENCOUNTER — Other Ambulatory Visit (HOSPITAL_COMMUNITY): Payer: Self-pay

## 2024-01-04 ENCOUNTER — Other Ambulatory Visit (HOSPITAL_COMMUNITY): Payer: Self-pay

## 2024-01-23 ENCOUNTER — Other Ambulatory Visit (HOSPITAL_COMMUNITY): Payer: Self-pay

## 2024-02-03 ENCOUNTER — Other Ambulatory Visit (HOSPITAL_COMMUNITY): Payer: Self-pay

## 2024-02-14 ENCOUNTER — Other Ambulatory Visit (HOSPITAL_COMMUNITY): Payer: Self-pay

## 2024-02-14 MED ORDER — AMPHETAMINE-DEXTROAMPHET ER 30 MG PO CP24
30.0000 mg | ORAL_CAPSULE | Freq: Every morning | ORAL | 0 refills | Status: AC
  Filled 2024-04-04: qty 30, 30d supply, fill #0

## 2024-02-14 MED ORDER — FLUOXETINE HCL 40 MG PO CAPS
40.0000 mg | ORAL_CAPSULE | Freq: Every day | ORAL | 1 refills | Status: AC
  Filled 2024-05-11: qty 30, 30d supply, fill #0
  Filled 2024-07-13: qty 30, 30d supply, fill #1

## 2024-02-14 MED ORDER — FLUOXETINE HCL 20 MG PO CAPS
20.0000 mg | ORAL_CAPSULE | Freq: Every day | ORAL | 1 refills | Status: AC
  Filled 2024-05-11: qty 30, 30d supply, fill #0

## 2024-02-14 MED ORDER — AMPHETAMINE-DEXTROAMPHET ER 30 MG PO CP24
30.0000 mg | ORAL_CAPSULE | Freq: Every morning | ORAL | 0 refills | Status: AC
  Filled 2024-02-27: qty 30, 30d supply, fill #0

## 2024-02-14 MED ORDER — AMPHETAMINE-DEXTROAMPHET ER 30 MG PO CP24
30.0000 mg | ORAL_CAPSULE | Freq: Every morning | ORAL | 0 refills | Status: AC
  Filled 2024-05-11: qty 30, 30d supply, fill #0

## 2024-02-27 ENCOUNTER — Other Ambulatory Visit (HOSPITAL_COMMUNITY): Payer: Self-pay

## 2024-04-04 ENCOUNTER — Other Ambulatory Visit (HOSPITAL_COMMUNITY): Payer: Self-pay

## 2024-04-06 ENCOUNTER — Other Ambulatory Visit (HOSPITAL_COMMUNITY): Payer: Self-pay

## 2024-05-11 ENCOUNTER — Other Ambulatory Visit (HOSPITAL_COMMUNITY): Payer: Self-pay

## 2024-05-15 ENCOUNTER — Other Ambulatory Visit (HOSPITAL_COMMUNITY): Payer: Self-pay

## 2024-05-15 MED ORDER — FLUOXETINE HCL 20 MG PO CAPS
20.0000 mg | ORAL_CAPSULE | Freq: Every day | ORAL | 1 refills | Status: AC
  Filled 2024-05-15 – 2024-06-11 (×2): qty 30, 30d supply, fill #0
  Filled 2024-07-13 (×2): qty 30, 30d supply, fill #1
  Filled 2024-08-10 (×2): qty 30, 30d supply, fill #2
  Filled 2024-09-11: qty 30, 30d supply, fill #3
  Filled 2024-10-12: qty 30, 30d supply, fill #4

## 2024-05-15 MED ORDER — AMPHETAMINE-DEXTROAMPHET ER 30 MG PO CP24
30.0000 mg | ORAL_CAPSULE | Freq: Every morning | ORAL | 0 refills | Status: DC
  Filled 2024-08-10: qty 30, 30d supply, fill #0

## 2024-05-15 MED ORDER — AMPHETAMINE-DEXTROAMPHET ER 30 MG PO CP24
30.0000 mg | ORAL_CAPSULE | Freq: Every morning | ORAL | 0 refills | Status: AC
  Filled 2024-07-13: qty 30, 30d supply, fill #0

## 2024-05-15 MED ORDER — FLUOXETINE HCL 40 MG PO CAPS
40.0000 mg | ORAL_CAPSULE | Freq: Every day | ORAL | 1 refills | Status: AC
  Filled 2024-05-15 – 2024-06-11 (×2): qty 30, 30d supply, fill #0
  Filled 2024-09-11: qty 30, 30d supply, fill #1
  Filled 2024-10-12: qty 30, 30d supply, fill #2

## 2024-05-15 MED ORDER — AMPHETAMINE-DEXTROAMPHET ER 30 MG PO CP24
30.0000 mg | ORAL_CAPSULE | Freq: Every morning | ORAL | 0 refills | Status: AC
  Filled 2024-06-11: qty 30, 30d supply, fill #0

## 2024-06-11 ENCOUNTER — Other Ambulatory Visit (HOSPITAL_COMMUNITY): Payer: Self-pay

## 2024-06-12 ENCOUNTER — Other Ambulatory Visit (HOSPITAL_COMMUNITY): Payer: Self-pay

## 2024-07-13 ENCOUNTER — Other Ambulatory Visit (HOSPITAL_COMMUNITY): Payer: Self-pay

## 2024-08-10 ENCOUNTER — Other Ambulatory Visit: Payer: Self-pay

## 2024-08-10 ENCOUNTER — Other Ambulatory Visit (HOSPITAL_COMMUNITY): Payer: Self-pay

## 2024-09-11 ENCOUNTER — Other Ambulatory Visit (HOSPITAL_COMMUNITY): Payer: Self-pay

## 2024-09-14 ENCOUNTER — Other Ambulatory Visit (HOSPITAL_COMMUNITY): Payer: Self-pay

## 2024-09-17 ENCOUNTER — Other Ambulatory Visit: Payer: Self-pay

## 2024-09-17 ENCOUNTER — Other Ambulatory Visit (HOSPITAL_COMMUNITY): Payer: Self-pay

## 2024-09-17 ENCOUNTER — Encounter (HOSPITAL_COMMUNITY): Payer: Self-pay

## 2024-09-22 ENCOUNTER — Other Ambulatory Visit (HOSPITAL_COMMUNITY): Payer: Self-pay

## 2024-09-22 ENCOUNTER — Other Ambulatory Visit: Payer: Self-pay

## 2024-09-22 MED ORDER — AMPHETAMINE-DEXTROAMPHET ER 30 MG PO CP24
30.0000 mg | ORAL_CAPSULE | Freq: Every morning | ORAL | 0 refills | Status: AC
  Filled 2024-09-22: qty 30, 30d supply, fill #0

## 2024-09-22 MED ORDER — AMPHETAMINE-DEXTROAMPHET ER 30 MG PO CP24
30.0000 mg | ORAL_CAPSULE | Freq: Every morning | ORAL | 0 refills | Status: AC
  Filled 2024-10-27: qty 30, 30d supply, fill #0

## 2024-09-22 MED ORDER — FLUOXETINE HCL 20 MG PO CAPS
20.0000 mg | ORAL_CAPSULE | Freq: Every day | ORAL | 1 refills | Status: AC
  Filled 2024-09-22 – 2024-11-16 (×2): qty 30, 30d supply, fill #0
  Filled 2024-12-15: qty 30, 30d supply, fill #1

## 2024-09-22 MED ORDER — AMPHETAMINE-DEXTROAMPHET ER 30 MG PO CP24
30.0000 mg | ORAL_CAPSULE | Freq: Every morning | ORAL | 0 refills | Status: DC
  Filled 2024-11-27 (×2): qty 30, 30d supply, fill #0

## 2024-09-22 MED ORDER — FLUOXETINE HCL 40 MG PO CAPS
40.0000 mg | ORAL_CAPSULE | Freq: Every day | ORAL | 1 refills | Status: AC
  Filled 2024-09-22 – 2024-11-16 (×2): qty 30, 30d supply, fill #0
  Filled 2024-12-15: qty 30, 30d supply, fill #1

## 2024-10-12 ENCOUNTER — Other Ambulatory Visit (HOSPITAL_COMMUNITY): Payer: Self-pay

## 2024-10-27 ENCOUNTER — Other Ambulatory Visit (HOSPITAL_COMMUNITY): Payer: Self-pay

## 2024-11-16 ENCOUNTER — Other Ambulatory Visit (HOSPITAL_COMMUNITY): Payer: Self-pay

## 2024-11-27 ENCOUNTER — Other Ambulatory Visit (HOSPITAL_COMMUNITY): Payer: Self-pay

## 2024-11-27 ENCOUNTER — Other Ambulatory Visit (HOSPITAL_BASED_OUTPATIENT_CLINIC_OR_DEPARTMENT_OTHER): Payer: Self-pay

## 2024-12-15 ENCOUNTER — Other Ambulatory Visit (HOSPITAL_COMMUNITY): Payer: Self-pay

## 2024-12-30 ENCOUNTER — Other Ambulatory Visit (HOSPITAL_COMMUNITY): Payer: Self-pay

## 2024-12-30 ENCOUNTER — Other Ambulatory Visit: Payer: Self-pay

## 2024-12-30 MED ORDER — AMPHETAMINE-DEXTROAMPHET ER 30 MG PO CP24
30.0000 mg | ORAL_CAPSULE | Freq: Every morning | ORAL | 0 refills | Status: AC
  Filled 2024-12-30: qty 30, 30d supply, fill #0
